# Patient Record
Sex: Female | Born: 1990 | Race: Black or African American | Hispanic: No | Marital: Single | Smoking: Never smoker
Health system: Southern US, Community
[De-identification: ages and names within clinical notes are randomized; demographics above are authoritative.]

## PROBLEM LIST (undated history)

## (undated) ENCOUNTER — Inpatient Hospital Stay (HOSPITAL_COMMUNITY): Payer: Self-pay

## (undated) DIAGNOSIS — R51 Headache: Secondary | ICD-10-CM

## (undated) DIAGNOSIS — B999 Unspecified infectious disease: Secondary | ICD-10-CM

## (undated) HISTORY — DX: Headache: R51

## (undated) HISTORY — DX: Unspecified infectious disease: B99.9

---
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
## 2009-03-12 ENCOUNTER — Emergency Department (HOSPITAL_COMMUNITY): Admission: EM | Admit: 2009-03-12 | Discharge: 2009-03-12 | Payer: Self-pay | Admitting: Family Medicine

## 2009-09-16 ENCOUNTER — Emergency Department (HOSPITAL_COMMUNITY): Admission: EM | Admit: 2009-09-16 | Discharge: 2009-09-16 | Payer: Self-pay | Admitting: Emergency Medicine

## 2009-09-18 ENCOUNTER — Emergency Department (HOSPITAL_COMMUNITY): Admission: EM | Admit: 2009-09-18 | Discharge: 2009-09-18 | Payer: Self-pay | Admitting: Family Medicine

## 2009-10-23 ENCOUNTER — Emergency Department (HOSPITAL_COMMUNITY): Admission: EM | Admit: 2009-10-23 | Discharge: 2009-10-23 | Payer: Self-pay | Admitting: Family Medicine

## 2010-03-18 ENCOUNTER — Emergency Department (HOSPITAL_COMMUNITY)
Admission: EM | Admit: 2010-03-18 | Discharge: 2010-03-18 | Payer: Self-pay | Source: Home / Self Care | Admitting: Family Medicine

## 2010-05-07 ENCOUNTER — Emergency Department (HOSPITAL_COMMUNITY): Admission: EM | Admit: 2010-05-07 | Discharge: 2010-05-07 | Payer: Self-pay | Admitting: Family Medicine

## 2010-09-12 LAB — WET PREP, GENITAL: Yeast Wet Prep HPF POC: NONE SEEN

## 2010-09-12 LAB — POCT URINALYSIS DIPSTICK
Bilirubin Urine: NEGATIVE
Glucose, UA: NEGATIVE mg/dL
Ketones, ur: NEGATIVE mg/dL
Protein, ur: NEGATIVE mg/dL

## 2010-09-12 LAB — POCT PREGNANCY, URINE: Preg Test, Ur: NEGATIVE

## 2010-09-12 LAB — GC/CHLAMYDIA PROBE AMP, GENITAL
Chlamydia, DNA Probe: NEGATIVE
GC Probe Amp, Genital: NEGATIVE

## 2010-09-23 LAB — URINALYSIS, ROUTINE W REFLEX MICROSCOPIC
Glucose, UA: NEGATIVE mg/dL
Ketones, ur: NEGATIVE mg/dL
pH: 6.5 (ref 5.0–8.0)

## 2010-09-23 LAB — POCT URINALYSIS DIP (DEVICE)
Glucose, UA: 100 mg/dL — AB
Hgb urine dipstick: NEGATIVE
Nitrite: POSITIVE — AB
Urobilinogen, UA: 1 mg/dL (ref 0.0–1.0)
pH: 6 (ref 5.0–8.0)

## 2010-09-23 LAB — URINE MICROSCOPIC-ADD ON

## 2010-09-23 LAB — URINE CULTURE

## 2010-10-04 LAB — WET PREP, GENITAL
Clue Cells Wet Prep HPF POC: NONE SEEN
Yeast Wet Prep HPF POC: NONE SEEN

## 2010-10-04 LAB — POCT PREGNANCY, URINE: Preg Test, Ur: NEGATIVE

## 2010-10-04 LAB — POCT URINALYSIS DIP (DEVICE)
Bilirubin Urine: NEGATIVE
Glucose, UA: NEGATIVE mg/dL
Ketones, ur: NEGATIVE mg/dL
Specific Gravity, Urine: 1.02 (ref 1.005–1.030)

## 2010-10-04 LAB — URINE CULTURE

## 2010-12-02 ENCOUNTER — Inpatient Hospital Stay (INDEPENDENT_AMBULATORY_CARE_PROVIDER_SITE_OTHER)
Admission: RE | Admit: 2010-12-02 | Discharge: 2010-12-02 | Disposition: A | Discharge status: Home / Self Care | Payer: 59 | Source: Ambulatory Visit | Attending: Family Medicine | Admitting: Family Medicine

## 2010-12-02 DIAGNOSIS — N760 Acute vaginitis: Secondary | ICD-10-CM

## 2010-12-02 DIAGNOSIS — R10814 Left lower quadrant abdominal tenderness: Secondary | ICD-10-CM

## 2010-12-02 LAB — POCT URINALYSIS DIP (DEVICE)
Glucose, UA: NEGATIVE mg/dL
Ketones, ur: NEGATIVE mg/dL
Specific Gravity, Urine: 1.025 (ref 1.005–1.030)
Urobilinogen, UA: 0.2 mg/dL (ref 0.0–1.0)

## 2010-12-02 LAB — WET PREP, GENITAL: Clue Cells Wet Prep HPF POC: NONE SEEN

## 2010-12-02 LAB — POCT PREGNANCY, URINE: Preg Test, Ur: NEGATIVE

## 2010-12-03 LAB — URINE CULTURE: Colony Count: 45000

## 2012-04-01 ENCOUNTER — Encounter: Payer: 59 | Admitting: Obstetrics and Gynecology

## 2012-04-02 ENCOUNTER — Ambulatory Visit (INDEPENDENT_AMBULATORY_CARE_PROVIDER_SITE_OTHER): Payer: 59 | Admitting: Obstetrics and Gynecology

## 2012-04-02 DIAGNOSIS — Z331 Pregnant state, incidental: Secondary | ICD-10-CM

## 2012-04-03 LAB — PRENATAL PANEL VII
Basophils Absolute: 0 10*3/uL (ref 0.0–0.1)
Basophils Relative: 0 % (ref 0–1)
Eosinophils Absolute: 0 10*3/uL (ref 0.0–0.7)
HIV: NONREACTIVE
Hemoglobin: 12.8 g/dL (ref 12.0–15.0)
Hepatitis B Surface Ag: NEGATIVE
MCHC: 33.8 g/dL (ref 30.0–36.0)
Monocytes Relative: 4 % (ref 3–12)
Neutro Abs: 3.1 10*3/uL (ref 1.7–7.7)
Neutrophils Relative %: 54 % (ref 43–77)
Platelets: 244 10*3/uL (ref 150–400)

## 2012-04-04 LAB — CULTURE, OB URINE: Colony Count: NO GROWTH

## 2012-04-06 LAB — HEMOGLOBINOPATHY EVALUATION
Hemoglobin Other: 0 %
Hgb A2 Quant: 2.5 % (ref 2.2–3.2)
Hgb A: 97.5 % (ref 96.8–97.8)
Hgb F Quant: 0 % (ref 0.0–2.0)
Hgb S Quant: 0 %

## 2012-04-08 ENCOUNTER — Telehealth: Payer: Self-pay | Admitting: Obstetrics and Gynecology

## 2012-04-08 NOTE — Telephone Encounter (Signed)
Tc to pt regarding msg, lm on vm to call back. 

## 2012-04-08 NOTE — Telephone Encounter (Signed)
Pt returned call, pt had several questions about her pregnancy and what to expect @ the next visit, all of pt's questions were answered, pt says she feels a lot better now.

## 2012-04-23 DIAGNOSIS — Z331 Pregnant state, incidental: Secondary | ICD-10-CM | POA: Insufficient documentation

## 2012-04-30 ENCOUNTER — Ambulatory Visit (INDEPENDENT_AMBULATORY_CARE_PROVIDER_SITE_OTHER): Payer: 59 | Admitting: Obstetrics and Gynecology

## 2012-04-30 VITALS — BP 120/64 | Wt 176.0 lb

## 2012-04-30 DIAGNOSIS — Z331 Pregnant state, incidental: Secondary | ICD-10-CM

## 2012-04-30 LAB — OB RESULTS CONSOLE GC/CHLAMYDIA
Chlamydia: NEGATIVE
Gonorrhea: NEGATIVE

## 2012-04-30 MED ORDER — METRONIDAZOLE 500 MG PO TABS: 500.0000 mg | ORAL_TABLET | Freq: Two times a day (BID) | ORAL | Status: DC

## 2012-04-30 MED ORDER — CITRANATAL 90 DHA 90-1 & 250 MG PO MISC: 1.0000 | Freq: Every day | ORAL | Status: DC

## 2012-04-30 MED ORDER — TERCONAZOLE 0.4 % VA CREA: 1.0000 | TOPICAL_CREAM | Freq: Every day | VAGINAL | Status: DC

## 2012-04-30 NOTE — Progress Notes (Signed)
Pt is here for her NOB work-up. Pt stated she has been having a lot of headaches and some sharpe pains . Pt stated no other issues today.

## 2012-05-04 LAB — PAP IG, CT-NG, RFX HPV ASCU: GC Probe Amp: NEGATIVE

## 2012-05-05 ENCOUNTER — Encounter: Payer: Self-pay | Admitting: Obstetrics and Gynecology

## 2012-05-05 NOTE — Progress Notes (Signed)
Patient ID: Tiffany Li, female   DOB: January 05, 1991, 21 y.o.   MRN: [id number] Tiffany Li is a 21 y.o. female presenting for new ob visit. Not using birth control at time of concepiton, without N,V, taking PNV, certain of LMP for [facility]. @MED  @IPILAPH @ OB History    Grav Para Term Preterm Abortions TAB SAB Ect Mult Living   1              Past Medical History  Diagnosis Date  . Infection     Yeast;not frequent  . Infection     BV;was frequent w/ Nuvaring/IUD  . Infection     UTI;not frequent  . Headache    Past Surgical History  Procedure Date  . Eye surgery 2005    Infection removed from a stye;outpt surgery   Family History: family history includes Gestational diabetes in her cousin; Hypertension in her brother, father, maternal grandfather, maternal grandmother, mother, paternal grandfather, and paternal grandmother; and Migraines in her maternal aunt and mother. Social History:  reports that she has never smoked. She has never used smokeless tobacco. She reports that she drinks alcohol. She reports that she does not use illicit drugs.  @ROS @    Blood pressure 120/64, weight 176 lb (79.833 kg), last menstrual period 02/18/2012. Physical exam: Calm, no distress, HEENT wnl lungs clear bilaterally, breasts bilaterally no masses, dimpling, or drainage, AP RRR, abd soft, gravid, nt, bowel sounds active, abdomen nontender,  Normal hair distrubition mons pubis,  EGBUS WNL, sterile speculum exam,  vagina pink, moist normal rugae,  cerix LTC, no cervical motion tenderness, No adnexal masses or tenderness Uterus size 11 Scant white discharge. DTR + 1 bilaterally no clonus No edema to lower extremities  Prenatal labs: ABO, Rh: O/POS/-- (10/04 1131) Antibody: NEG (10/04 1131) Rubella:  Immune RPR: NON REAC (10/04 1131)  HBsAg: NEGATIVE (10/04 1131)  HIV: NON REACTIVE (10/04 1131)    Assessment/Plan: [redacted]w[redacted]d GC/CHL sent WET PREP neg PAP sent ULTRASOUND Genetic testing  reviewed will call if desires 1st trimester screen. Collaboration with Dr. Su Hilt. [facility], Tiffany Li 05/05/2012, 10:33 PM late entry from 04/30/2012 Lavera Guise, CNM

## 2012-05-26 ENCOUNTER — Ambulatory Visit (INDEPENDENT_AMBULATORY_CARE_PROVIDER_SITE_OTHER): Payer: 59 | Admitting: Obstetrics and Gynecology

## 2012-05-26 VITALS — BP 110/60 | Wt 169.0 lb

## 2012-05-26 DIAGNOSIS — Z3400 Encounter for supervision of normal first pregnancy, unspecified trimester: Secondary | ICD-10-CM

## 2012-05-26 DIAGNOSIS — Z0289 Encounter for other administrative examinations: Secondary | ICD-10-CM

## 2012-05-26 NOTE — Progress Notes (Signed)
Had reflux type symptoms yesterday, with vomiting x 1.  Now resolved. Concerned if BV has resolved--still has some d/c. Sporadic HAs--had before pregnancy.  Can try Ibuprophen 600 mg po q 6 prn. Declines genetic screening. Wet prep:  Lactobacilli, leukorrhea.  Comfort measures reviewed. [place] in 4 weeks for anatomy

## 2012-05-26 NOTE — Progress Notes (Signed)
Pt stated been having some pain in her stomach/ c/o of vomiting yesterday. Pt stated no other issues

## 2012-06-22 ENCOUNTER — Ambulatory Visit (INDEPENDENT_AMBULATORY_CARE_PROVIDER_SITE_OTHER): Payer: 59

## 2012-06-22 ENCOUNTER — Encounter: Payer: Self-pay | Admitting: Obstetrics and Gynecology

## 2012-06-22 ENCOUNTER — Ambulatory Visit (INDEPENDENT_AMBULATORY_CARE_PROVIDER_SITE_OTHER): Payer: 59 | Admitting: Obstetrics and Gynecology

## 2012-06-22 VITALS — BP 130/78 | Wt 172.0 lb

## 2012-06-22 DIAGNOSIS — Z3400 Encounter for supervision of normal first pregnancy, unspecified trimester: Secondary | ICD-10-CM

## 2012-06-22 DIAGNOSIS — Z3689 Encounter for other specified antenatal screening: Secondary | ICD-10-CM

## 2012-06-22 DIAGNOSIS — Z331 Pregnant state, incidental: Secondary | ICD-10-CM

## 2012-06-22 NOTE — Progress Notes (Signed)
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  She does not have time to rest or drink water.  She would like to cut back her hours to 24-32 hrs a week.  I agree this will be better for her especially with maintaining hydration.

## 2012-06-22 NOTE — Patient Instructions (Signed)
ABCs of Pregnancy  A  Antepartum care is very important. Be sure you see your doctor and get prenatal care as soon as you think you are pregnant. At this time, you will be tested for infection, genetic abnormalities and potential problems with you and the pregnancy. This is the time to discuss diet, exercise, work, medications, labor, pain medication during labor and the possibility of a cesarean delivery. Ask any questions that may concern you. It is important to see your doctor regularly throughout your pregnancy. Avoid exposure to toxic substances and chemicals - such as cleaning solvents, lead and mercury, some insecticides, and paint. Pregnant women should avoid exposure to paint fumes, and fumes that cause you to feel ill, dizzy or faint. When possible, it is a good idea to have a pre-pregnancy consultation with your caregiver to begin some important recommendations your caregiver suggests such as, taking folic acid, exercising, quitting smoking, avoiding alcoholic beverages, etc.  B  Breastfeeding is the healthiest choice for both you and your baby. It has many nutritional benefits for the baby and health benefits for the mother. It also creates a very tight and loving bond between the baby and mother. Talk to your doctor, your family and friends, and your employer about how you choose to feed your baby and how they can support you in your decision. Not all birth defects can be prevented, but a woman can take actions that may increase her chance of having a healthy baby. Many birth defects happen very early in pregnancy, sometimes before a woman even knows she is pregnant. Birth defects or abnormalities of any child in your or the father's family should be discussed with your caregiver. Get a good support bra as your breast size changes. Wear it especially when you exercise and when nursing.   C  Celebrate the news of your pregnancy with the your spouse/father and family. Childbirth classes are helpful to  take for you and the spouse/father because it helps to understand what happens during the pregnancy, labor and delivery. Cesarean delivery should be discussed with your doctor so you are prepared for that possibility. The pros and cons of circumcision if it is a boy, should be discussed with your pediatrician. Cigarette smoking during pregnancy can result in low birth weight babies. It has been associated with infertility, miscarriages, tubal pregnancies, infant death (mortality) and poor health (morbidity) in childhood. Additionally, cigarette smoking may cause long-term learning disabilities. If you smoke, you should try to quit before getting pregnant and not smoke during the pregnancy. Secondary smoke may also harm a mother and her developing baby. It is a good idea to ask people to stop smoking around you during your pregnancy and after the baby is born. Extra calcium is necessary when you are pregnant and is found in your prenatal vitamin, in dairy products, green leafy vegetables and in calcium supplements.  D  A healthy diet according to your current weight and height, along with vitamins and mineral supplements should be discussed with your caregiver. Domestic abuse or violence should be made known to your doctor right away to get the situation corrected. Drink more water when you exercise to keep hydrated. Discomfort of your back and legs usually develops and progresses from the middle of the second trimester through to delivery of the baby. This is because of the enlarging baby and uterus, which may also affect your balance. Do not take illegal drugs. Illegal drugs can seriously harm the baby and you. Drink extra   fluids (water is best) throughout pregnancy to help your body keep up with the increases in your blood volume. Drink at least 6 to 8 glasses of water, fruit juice, or milk each day. A good way to know you are drinking enough fluid is when your urine looks almost like clear water or is very light  yellow.   E  Eat healthy to get the nutrients you and your unborn baby need. Your meals should include the five basic food groups. Exercise (30 minutes of light to moderate exercise a day) is important and encouraged during pregnancy, if there are no medical problems or problems with the pregnancy. Exercise that causes discomfort or dizziness should be stopped and reported to your caregiver. Emotions during pregnancy can change from being ecstatic to depression and should be understood by you, your partner and your family.  F  Fetal screening with ultrasound, amniocentesis and monitoring during pregnancy and labor is common and sometimes necessary. Take 400 micrograms of folic acid daily both before, when possible, and during the first few months of pregnancy to reduce the risk of birth defects of the brain and spine. All women who could possibly become pregnant should take a vitamin with folic acid, every day. It is also important to eat a healthy diet with fortified foods (enriched grain products, including cereals, rice, breads, and pastas) and foods with natural sources of folate (orange juice, green leafy vegetables, beans, peanuts, broccoli, asparagus, peas, and lentils). The father should be involved with all aspects of the pregnancy including, the prenatal care, childbirth classes, labor, delivery, and postpartum time. Fathers may also have emotional concerns about being a father, financial needs, and raising a family.  G  Genetic testing should be done appropriately. It is important to know your family and the father's history. If there have been problems with pregnancies or birth defects in your family, report these to your doctor. Also, genetic counselors can talk with you about the information you might need in making decisions about having a family. You can call a major medical center in your area for help in finding a board-certified genetic counselor. Genetic testing and counseling should be done  before pregnancy when possible, especially if there is a history of problems in the mother's or father's family. Certain ethnic backgrounds are more at risk for genetic defects.  H  Get familiar with the hospital where you will be having your baby. Get to know how long it takes to get there, the labor and delivery area, and the hospital procedures. Be sure your medical insurance is accepted there. Get your home ready for the baby including, clothes, the baby's room (when possible), furniture and car seat. Hand washing is important throughout the day, especially after handling raw meat and poultry, changing the baby's diaper or using the bathroom. This can help prevent the spread of many bacteria and viruses that cause infection. Your hair may become dry and thinner, but will return to normal a few weeks after the baby is born. Heartburn is a common problem that can be treated by taking antacids recommended by your caregiver, eating smaller meals 5 or 6 times a day, not drinking liquids when eating, drinking between meals and raising the head of your bed 2 to 3 inches.  I  Insurance to cover you, the baby, doctor and hospital should be reviewed so that you will be prepared to pay any costs not covered by your insurance plan. If you do not have medical insurance,   there are usually clinics and services available for you in your community. Take 30 milligrams of iron during your pregnancy as prescribed by your doctor to reduce the risk of low red blood cells (anemia) later in pregnancy. All women of childbearing age should eat a diet rich in iron.  J  There should be a joint effort for the mother, father and any other children to adapt to the pregnancy financially, emotionally, and psychologically during the pregnancy. Join a support group for moms-to-be. Or, join a class on parenting or childbirth. Have the family participate when possible.  K  Know your limits. Let your caregiver know if you experience any of the  following:   · Pain of any kind.  · Strong cramps.  · You develop a lot of weight in a short period of time (5 pounds in 3 to 5 days).  · Vaginal bleeding, leaking of amniotic fluid.  · Headache, vision problems.  · Dizziness, fainting, shortness of breath.  · Chest pain.  · Fever of 102° F (38.9° C) or higher.  · Gush of clear fluid from your vagina.  · Painful urination.  · Domestic violence.  · Irregular heartbeat (palpitations).  · Rapid beating of the heart (tachycardia).  · Constant feeling sick to your stomach (nauseous) and vomiting.  · Trouble walking, fluid retention (edema).  · Muscle weakness.  · If your baby has decreased activity.  · Persistent diarrhea.  · Abnormal vaginal discharge.  · Uterine contractions at 20-minute intervals.  · Back pain that travels down your leg.  L  Learn and practice that what you eat and drink should be in moderation and healthy for you and your baby. Legal drugs such as alcohol and caffeine are important issues for pregnant women. There is no safe amount of alcohol a woman can drink while pregnant. Fetal alcohol syndrome, a disorder characterized by growth retardation, facial abnormalities, and central nervous system dysfunction, is caused by a woman's use of alcohol during pregnancy. Caffeine, found in tea, coffee, soft drinks and chocolate, should also be limited. Be sure to read labels when trying to cut down on caffeine during pregnancy. More than 200 foods, beverages, and over-the-counter medications contain caffeine and have a high salt content! There are coffees and teas that do not contain caffeine.  M  Medical conditions such as diabetes, epilepsy, and high blood pressure should be treated and kept under control before pregnancy when possible, but especially during pregnancy. Ask your caregiver about any medications that may need to be changed or adjusted during pregnancy. If you are currently taking any medications, ask your caregiver if it is safe to take them  while you are pregnant or before getting pregnant when possible. Also, be sure to discuss any herbs or vitamins you are taking. They are medicines, too! Discuss with your doctor all medications, prescribed and over-the-counter, that you are taking. During your prenatal visit, discuss the medications your doctor may give you during labor and delivery.  N  Never be afraid to ask your doctor or caregiver questions about your health, the progress of the pregnancy, family problems, stressful situations, and recommendation for a pediatrician, if you do not have one. It is better to take all precautions and discuss any questions or concerns you may have during your office visits. It is a good idea to write down your questions before you visit the doctor.  O  Over-the-counter cough and cold remedies may contain alcohol or other ingredients that should   be avoided during pregnancy. Ask your caregiver about prescription, herbs or over-the-counter medications that you are taking or may consider taking while pregnant.   P  Physical activity during pregnancy can benefit both you and your baby by lessening discomfort and fatigue, providing a sense of well-being, and increasing the likelihood of early recovery after delivery. Light to moderate exercise during pregnancy strengthens the belly (abdominal) and back muscles. This helps improve posture. Practicing yoga, walking, swimming, and cycling on a stationary bicycle are usually safe exercises for pregnant women. Avoid scuba diving, exercise at high altitudes (over 3000 feet), skiing, horseback riding, contact sports, etc. Always check with your doctor before beginning any kind of exercise, especially during pregnancy and especially if you did not exercise before getting pregnant.  Q  Queasiness, stomach upset and morning sickness are common during pregnancy. Eating a couple of crackers or dry toast before getting out of bed. Foods that you normally love may make you feel sick to  your stomach. You may need to substitute other nutritious foods. Eating 5 or 6 small meals a day instead of 3 large ones may make you feel better. Do not drink with your meals, drink between meals. Questions that you have should be written down and asked during your prenatal visits.  R  Read about and make plans to baby-proof your home. There are important tips for making your home a safer environment for your baby. Review the tips and make your home safer for you and your baby. Read food labels regarding calories, salt and fat content in the food.  S  Saunas, hot tubs, and steam rooms should be avoided while you are pregnant. Excessive high heat may be harmful during your pregnancy. Your caregiver will screen and examine you for sexually transmitted diseases and genetic disorders during your prenatal visits. Learn the signs of labor. Sexual relations while pregnant is safe unless there is a medical or pregnancy problem and your caregiver advises against it.  T  Traveling long distances should be avoided especially in the third trimester of your pregnancy. If you do have to travel out of state, be sure to take a copy of your medical records and medical insurance plan with you. You should not travel long distances without seeing your doctor first. Most airlines will not allow you to travel after 36 weeks of pregnancy. Toxoplasmosis is an infection caused by a parasite that can seriously harm an unborn baby. Avoid eating undercooked meat and handling cat litter. Be sure to wear gloves when gardening. Tingling of the hands and fingers is not unusual and is due to fluid retention. This will go away after the baby is born.  U  Womb (uterus) size increases during the first trimester. Your kidneys will begin to function more efficiently. This may cause you to feel the need to urinate more often. You may also leak urine when sneezing, coughing or laughing. This is due to the growing uterus pressing against your bladder,  which lies directly in front of and slightly under the uterus during the first few months of pregnancy. If you experience burning along with frequency of urination or bloody urine, be sure to tell your doctor. The size of your uterus in the third trimester may cause a problem with your balance. It is advisable to maintain good posture and avoid wearing high heels during this time. An ultrasound of your baby may be necessary during your pregnancy and is safe for you and your baby.  V    Vaccinations are an important concern for pregnant women. Get needed vaccines before pregnancy. Center for Disease Control (www.cdc.gov) has clear guidelines for the use of vaccines during pregnancy. Review the list, be sure to discuss it with your doctor. Prenatal vitamins are helpful and healthy for you and the baby. Do not take extra vitamins except what is recommended. Taking too much of certain vitamins can cause overdose problems. Continuous vomiting should be reported to your caregiver. Varicose veins may appear especially if there is a family history of varicose veins. They should subside after the delivery of the baby. Support hose helps if there is leg discomfort.  W  Being overweight or underweight during pregnancy may cause problems. Try to get within 15 pounds of your ideal weight before pregnancy. Remember, pregnancy is not a time to be dieting! Do not stop eating or start skipping meals as your weight increases. Both you and your baby need the calories and nutrition you receive from a healthy diet. Be sure to consult with your doctor about your diet. There is a formula and diet plan available depending on whether you are overweight or underweight. Your caregiver or nutritionist can help and advise you if necessary.  X  Avoid X-rays. If you must have dental work or diagnostic tests, tell your dentist or physician that you are pregnant so that extra care can be taken. X-rays should only be taken when the risks of not taking  them outweigh the risk of taking them. If needed, only the minimum amount of radiation should be used. When X-rays are necessary, protective lead shields should be used to cover areas of the body that are not being X-rayed.  Y  Your baby loves you. Breastfeeding your baby creates a loving and very close bond between the two of you. Give your baby a healthy environment to live in while you are pregnant. Infants and children require constant care and guidance. Their health and safety should be carefully watched at all times. After the baby is born, rest or take a nap when the baby is sleeping.  Z  Get your ZZZs. Be sure to get plenty of rest. Resting on your side as often as possible, especially on your left side is advised. It provides the best circulation to your baby and helps reduce swelling. Try taking a nap for 30 to 45 minutes in the afternoon when possible. After the baby is born rest or take a nap when the baby is sleeping. Try elevating your feet for that amount of time when possible. It helps the circulation in your legs and helps reduce swelling.   Most information courtesy of the CDC.  Document Released: 06/16/2005 Document Revised: 09/08/2011 Document Reviewed: 02/28/2009  [employer]® Patient Information ©2013 [employer].

## 2012-06-22 NOTE — Progress Notes (Signed)
Pt c/o ligament pain.

## 2012-06-25 ENCOUNTER — Other Ambulatory Visit: Payer: Self-pay | Admitting: Obstetrics and Gynecology

## 2012-06-25 DIAGNOSIS — Z3400 Encounter for supervision of normal first pregnancy, unspecified trimester: Secondary | ICD-10-CM

## 2012-06-25 LAB — US OB COMP + 14 WK

## 2012-06-30 NOTE — L&D Delivery Note (Signed)
Delivery Note At 7:09 PM a viable female, "Pecola Leisure", was delivered via Vaginal, Spontaneous Delivery (Presentation: Left Occiput Anterior).  APGAR: 6, 9; weight .   Placenta status: Intact, Spontaneous.  Cord: 3 vessels with the following complications: None.  Cord pH: NA  Caput crowned for approx 30 min before full crowning--MLE performed under existing epidural and additional lidocaine infiltration. Baby floppy at delivery, but HR >100--cord clamped by CNM, cut by FOB, and baby to warmer.  NICU team called, with baby quickly responsive to drying and stimulation. Skin to skin with mom after assessment by NICU team.  Anesthesia: Epidural Local  Episiotomy: 2nd degree MLE Lacerations: None Suture Repair: 3.0 vicryl Est. Blood Loss (mL): 250 cc  Mom to postpartum.  Baby to skin to skin. Inpatient circ planned.  Nigel Bridgeman 12/03/2012, 7:56 PM

## 2012-07-23 ENCOUNTER — Ambulatory Visit: Payer: 59 | Admitting: Obstetrics and Gynecology

## 2012-07-23 ENCOUNTER — Encounter: Payer: 59 | Admitting: Obstetrics and Gynecology

## 2012-07-23 VITALS — BP 110/58 | Ht 67.0 in | Wt 180.0 lb

## 2012-07-23 DIAGNOSIS — Z3490 Encounter for supervision of normal pregnancy, unspecified, unspecified trimester: Secondary | ICD-10-CM

## 2012-07-23 NOTE — Progress Notes (Signed)
Pt stated no issues today.  

## 2012-07-23 NOTE — Progress Notes (Signed)
Doing well.  No issues. Glucola NV, with TSH and Vit D due to increased fatigue. Feels better since decreasing work hours.

## 2012-08-14 ENCOUNTER — Other Ambulatory Visit: Payer: Self-pay

## 2012-08-20 ENCOUNTER — Ambulatory Visit: Payer: 59 | Admitting: Obstetrics and Gynecology

## 2012-08-20 ENCOUNTER — Other Ambulatory Visit: Payer: 59

## 2012-08-20 ENCOUNTER — Encounter: Payer: Self-pay | Admitting: Obstetrics and Gynecology

## 2012-08-20 VITALS — BP 140/80 | Wt 185.0 lb

## 2012-08-20 DIAGNOSIS — Z1321 Encounter for screening for nutritional disorder: Secondary | ICD-10-CM

## 2012-08-20 DIAGNOSIS — Z331 Pregnant state, incidental: Secondary | ICD-10-CM

## 2012-08-20 DIAGNOSIS — O9A219 Injury, poisoning and certain other consequences of external causes complicating pregnancy, unspecified trimester: Secondary | ICD-10-CM

## 2012-08-20 DIAGNOSIS — Z3400 Encounter for supervision of normal first pregnancy, unspecified trimester: Secondary | ICD-10-CM

## 2012-08-20 LAB — CBC
HCT: 29.9 % — ABNORMAL LOW (ref 36.0–46.0)
Hemoglobin: 10.4 g/dL — ABNORMAL LOW (ref 12.0–15.0)
MCH: 27.7 pg (ref 26.0–34.0)
MCV: 79.7 fL (ref 78.0–100.0)
Platelets: 209 10*3/uL (ref 150–400)
RBC: 3.75 MIL/uL — ABNORMAL LOW (ref 3.87–5.11)
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
## 2012-08-21 LAB — VITAMIN D 25 HYDROXY (VIT D DEFICIENCY, FRACTURES): Vit D, 25-Hydroxy: 23 ng/mL — ABNORMAL LOW (ref 30–89)

## 2012-08-23 ENCOUNTER — Telehealth: Payer: Self-pay | Admitting: Obstetrics and Gynecology

## 2012-08-23 NOTE — Telephone Encounter (Signed)
TC from pt. 9:00 Am. States when got to work felt dizzy and had double vision. Had bagel, fruit and bacon 20-30 min before. + FM. Still feels somewhat dizzyf Has had 2 bottles water. Advised pt eat protein snack, rest and call if no improvement. Discussed eating protein frequently throughout the day to stabilize blood sugar. Pt verbalizes comprehension.

## 2012-08-23 NOTE — Telephone Encounter (Signed)
Questions answered already

## 2012-09-03 ENCOUNTER — Ambulatory Visit: Payer: 59 | Admitting: Certified Nurse Midwife

## 2012-09-03 ENCOUNTER — Encounter: Payer: Self-pay | Admitting: Certified Nurse Midwife

## 2012-09-03 VITALS — BP 124/62 | Wt 191.0 lb

## 2012-09-03 DIAGNOSIS — D649 Anemia, unspecified: Secondary | ICD-10-CM

## 2012-09-03 DIAGNOSIS — M549 Dorsalgia, unspecified: Secondary | ICD-10-CM

## 2012-09-03 DIAGNOSIS — Z331 Pregnant state, incidental: Secondary | ICD-10-CM

## 2012-09-03 NOTE — Progress Notes (Signed)
[redacted]w[redacted]d Pt c/o constant back pain and pain in shoulder blades.

## 2012-09-03 NOTE — Progress Notes (Signed)
Patient ID: Tiffany Li, female   DOB: 1990/08/16, 22 y.o.   MRN: [id number] Overall pt. Is doing well.  Works full time causing some fatigue and low back Pain without injury .  Not lifting greater than 15  lbs. Has support at work and home. Denies contractions, vag. bleeding or UTI symptoms.    PE  Heart: RRR without murmurs  Lungs are C to A  Abd. Soft and non-tender FHT's regular  Back:  Nl RoM, Nl curvature of the spine   Ambulating well  Vits and Fe sometimes upset stomach Plan Plans to eat foods with more protein and FE  Increase Fluids  Support shoes at work  Tylenol prn  Heat to back, massage  Call if no improvement  C. Armer CNM, FNP

## 2012-09-06 ENCOUNTER — Telehealth: Payer: Self-pay

## 2012-09-06 NOTE — Telephone Encounter (Signed)
Spoke to pt. Vit D level and iron levels are low. Iron supplement 325 mg Ferrous sulfate recommended daily, along w/ PNV. For Vit D deficiency, AR  Would like her to take 4,000 IU's daily. Pt is agreeable. Pt c/o back pain not improving. She wonders if she can have something for it. Per VL on call we can give her Flexeril 10 mg tid prn. # 36 NO RF's . Rx  Called to [facility] pt pharmacy. Melody Comas A

## 2012-09-16 ENCOUNTER — Ambulatory Visit: Payer: 59 | Admitting: Obstetrics and Gynecology

## 2012-09-16 ENCOUNTER — Encounter: Payer: Self-pay | Admitting: Obstetrics and Gynecology

## 2012-09-16 VITALS — BP 126/62 | Wt 191.0 lb

## 2012-09-16 DIAGNOSIS — Z331 Pregnant state, incidental: Secondary | ICD-10-CM

## 2012-09-16 DIAGNOSIS — D649 Anemia, unspecified: Secondary | ICD-10-CM

## 2012-09-16 NOTE — Progress Notes (Signed)
Pt stated she wanted to go out on maternity   leave from one  Of her 2 jobs. 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 Pt stated no other issues today.

## 2012-11-08 ENCOUNTER — Inpatient Hospital Stay (HOSPITAL_COMMUNITY)
Admission: AD | Admit: 2012-11-08 | Discharge: 2012-11-08 | Disposition: A | Discharge status: Home / Self Care | Payer: 59 | Source: Ambulatory Visit | Attending: Obstetrics and Gynecology | Admitting: Obstetrics and Gynecology

## 2012-11-08 ENCOUNTER — Encounter (HOSPITAL_COMMUNITY): Payer: Self-pay | Admitting: *Deleted

## 2012-11-08 ENCOUNTER — Inpatient Hospital Stay (HOSPITAL_COMMUNITY): Payer: 59

## 2012-11-08 DIAGNOSIS — O368190 Decreased fetal movements, unspecified trimester, not applicable or unspecified: Secondary | ICD-10-CM | POA: Insufficient documentation

## 2012-11-08 DIAGNOSIS — O4700 False labor before 37 completed weeks of gestation, unspecified trimester: Secondary | ICD-10-CM | POA: Insufficient documentation

## 2012-11-15 ENCOUNTER — Encounter (HOSPITAL_COMMUNITY): Payer: Self-pay

## 2012-11-15 ENCOUNTER — Inpatient Hospital Stay (HOSPITAL_COMMUNITY)
Admission: AD | Admit: 2012-11-15 | Discharge: 2012-11-15 | Disposition: A | Discharge status: Home / Self Care | Payer: 59 | Source: Ambulatory Visit | Attending: Obstetrics and Gynecology | Admitting: Obstetrics and Gynecology

## 2012-11-15 DIAGNOSIS — R109 Unspecified abdominal pain: Secondary | ICD-10-CM | POA: Insufficient documentation

## 2012-11-15 DIAGNOSIS — O479 False labor, unspecified: Secondary | ICD-10-CM | POA: Insufficient documentation

## 2012-11-15 DIAGNOSIS — Z0289 Encounter for other administrative examinations: Secondary | ICD-10-CM

## 2012-11-15 DIAGNOSIS — D649 Anemia, unspecified: Secondary | ICD-10-CM

## 2012-11-15 DIAGNOSIS — O471 False labor at or after 37 completed weeks of gestation: Secondary | ICD-10-CM

## 2012-11-15 MED ORDER — BUTORPHANOL TARTRATE 1 MG/ML IJ SOLN
1.0000 mg | Freq: Once | INTRAMUSCULAR | Status: AC | PRN
  Administered 2012-11-15: 1 mg via INTRAVENOUS
  Filled 2012-11-15: qty 1

## 2012-11-15 MED ORDER — LACTATED RINGERS IV BOLUS (SEPSIS)
1000.0000 mL | Freq: Once | INTRAVENOUS | Status: AC
  Administered 2012-11-15: 1000 mL via INTRAVENOUS

## 2012-11-15 NOTE — MAU Provider Note (Signed)
History   Tiffany Li is a 22y.o. BF at [redacted]w[redacted]d who presents from office for labor check.  Cx "1cm" per office exam, but RN from office calling report said cx was "closed." Pt reports ctxs q 2-5 min since 0845.  No LOF or VB.  GFM.  No UTI or PIH s/s.  Pt's pain is primarily rectal pressure, and pain that run along midline of abdomen w/ ctxs.  Accompanied by s.o.  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  Pt did have a "small" BM earlier this AM.  States she has been having intermittent "BH" ctxs over the last week.   . Patient Active Problem List   Diagnosis Date Noted  . Anemia 09/03/2012  . 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 Pregnant state, incidental 04/23/2012    CSN: [id number]  Arrival date and time: 11/15/12 1229   None     Chief Complaint  Patient presents with  . Contractions   HPI  OB History   Grav Para Term Preterm Abortions TAB SAB Ect Mult Living   1               Past Medical History  Diagnosis Date  . Infection     Yeast;not frequent  . Infection     BV;was frequent w/ Nuvaring/IUD  . Infection     UTI;not frequent  . Headache     Past Surgical History  Procedure Laterality Date  . Eye surgery  2005    Infection removed from a stye;outpt surgery    Family History  Problem Relation Age of Onset  . Hypertension Mother   . Hypertension Brother   . Hypertension Maternal Grandmother   . Hypertension Maternal Grandfather   . Hypertension Paternal Grandmother   . Hypertension Paternal Grandfather   . Hypertension Father   . Migraines Mother   . Migraines Maternal Aunt   . Gestational diabetes Cousin     maternal    History  Substance Use Topics  . Smoking status: Never Smoker   . Smokeless tobacco: Never Used  . Alcohol Use: Yes     Comment: Occasionally    Allergies: No Known Allergies  Prescriptions prior to admission  Medication Sig Dispense Refill  . cholecalciferol (VITAMIN D) 1000 UNITS tablet Take  1,000 Units by mouth daily.       . cyclobenzaprine (FLEXERIL) 10 MG tablet Take 10 mg by mouth 3 (three) times daily as needed for muscle spasms.      . diphenhydrAMINE (BENADRYL) 25 MG tablet Take 25 mg by mouth at bedtime as needed for sleep.      Marland Kitchen docusate sodium (COLACE) 100 MG capsule Take 100 mg by mouth 2 (two) times daily.      . ferrous fumarate (HEMOCYTE - 106 MG FE) 325 (106 FE) MG TABS Take 1 tablet by mouth daily.       . Prenatal Vit-Fe Sulfate-FA (PRENATAL VITAMIN PO) Take 1 tablet by mouth daily.         ROS--see HPI Physical Exam   Blood pressure 124/72, pulse 98, temperature 97.8 F (36.6 C), temperature source Oral, resp. rate 16, height 5\' 7"  (1.702 m), weight 197 lb 8 oz (89.585 kg), last menstrual period 02/18/2012.  Physical Exam  Constitutional: She is oriented to person, place, and time. She appears well-developed and well-nourished. No distress.  HENT:  Head: Normocephalic and atraumatic.  Eyes: Pupils are equal, round, and reactive to  light.  Cardiovascular: Normal rate.   Respiratory: Effort normal.  GI: Soft.  gravid  Genitourinary:  Deferred cervical exam  Neurological: She is alert and oriented to person, place, and time.  Skin: Skin is warm and dry.  Psychiatric: She has a normal mood and affect. Her behavior is normal. Judgment and thought content normal.    MAU Course  Procedures 1. One Liter LR 2. Stadol 1mg  IV x1 w/ good benefit 3. NST--130, reactive, no decels, moderate variability; TOCO: occ'l ctxs w/ some UI  Assessment and Plan  1. [redacted]w[redacted]d 2. Threatened labor at term 3. Cat I FHT 4. Low Vit d and Hgb during pregnancy  1. D/c'd home after Stadol helped relieve some of her pain; she stated she didn't like the way it made her feel overall though.   2. Labor precautions and FKC rev'd 3. Has appt this Thurs which I encouraged her to keep, or f/u prn   Amaryah Mallen H 11/15/2012, 2:58 PM

## 2012-11-15 NOTE — MAU Note (Signed)
Pt states was at MD office today, cervix is very posterior. Began having ctx's this am around 0845, denies bleeding or lof. Now ctx's are q2 minutes apart.

## 2012-11-30 ENCOUNTER — Encounter (HOSPITAL_COMMUNITY): Payer: Self-pay | Admitting: *Deleted

## 2012-11-30 ENCOUNTER — Telehealth (HOSPITAL_COMMUNITY): Payer: Self-pay | Admitting: *Deleted

## 2012-11-30 LAB — OB RESULTS CONSOLE GBS: GBS: POSITIVE

## 2012-11-30 NOTE — Telephone Encounter (Signed)
Preadmission screen  

## 2012-12-01 ENCOUNTER — Inpatient Hospital Stay (HOSPITAL_COMMUNITY)
Admission: RE | Admit: 2012-12-01 | Discharge: 2012-12-05 | DRG: 775 | Disposition: A | Discharge status: Home / Self Care | Payer: 59 | Source: Ambulatory Visit | Attending: Obstetrics and Gynecology | Admitting: Obstetrics and Gynecology

## 2012-12-01 ENCOUNTER — Encounter (HOSPITAL_COMMUNITY): Payer: Self-pay

## 2012-12-01 VITALS — BP 100/62 | HR 77 | Temp 97.6°F | Resp 20 | Ht 67.0 in | Wt 197.0 lb

## 2012-12-01 DIAGNOSIS — Z22330 Carrier of Group B streptococcus: Secondary | ICD-10-CM

## 2012-12-01 DIAGNOSIS — O480 Post-term pregnancy: Principal | ICD-10-CM | POA: Diagnosis present

## 2012-12-01 DIAGNOSIS — D649 Anemia, unspecified: Secondary | ICD-10-CM

## 2012-12-01 DIAGNOSIS — O99892 Other specified diseases and conditions complicating childbirth: Secondary | ICD-10-CM | POA: Diagnosis present

## 2012-12-01 DIAGNOSIS — Z0289 Encounter for other administrative examinations: Secondary | ICD-10-CM

## 2012-12-01 LAB — CBC
HCT: 33.4 % — ABNORMAL LOW (ref 36.0–46.0)
MCHC: 33.5 g/dL (ref 30.0–36.0)
Platelets: 206 10*3/uL (ref 150–400)
RDW: 14.1 % (ref 11.5–15.5)
WBC: 6.7 10*3/uL (ref 4.0–10.5)

## 2012-12-01 MED ORDER — LACTATED RINGERS IV SOLN
INTRAVENOUS | Status: DC
  Administered 2012-12-01 – 2012-12-02 (×2): via INTRAVENOUS
  Administered 2012-12-03: 1000 mL via INTRAVENOUS

## 2012-12-01 MED ORDER — OXYTOCIN BOLUS FROM INFUSION: 500.0000 mL | INTRAVENOUS | Status: DC

## 2012-12-01 MED ORDER — OXYCODONE-ACETAMINOPHEN 5-325 MG PO TABS: 1.0000 | ORAL_TABLET | ORAL | Status: DC | PRN

## 2012-12-01 MED ORDER — OXYTOCIN 40 UNITS IN LACTATED RINGERS INFUSION - SIMPLE MED: 62.5000 mL/h | INTRAVENOUS | Status: DC

## 2012-12-01 MED ORDER — PENICILLIN G POTASSIUM 5000000 UNITS IJ SOLR
5.0000 10*6.[IU] | Freq: Once | INTRAVENOUS | Status: DC
  Filled 2012-12-01: qty 5

## 2012-12-01 MED ORDER — IBUPROFEN 600 MG PO TABS: 600.0000 mg | ORAL_TABLET | Freq: Four times a day (QID) | ORAL | Status: DC | PRN

## 2012-12-01 MED ORDER — ONDANSETRON HCL 4 MG/2ML IJ SOLN
4.0000 mg | Freq: Four times a day (QID) | INTRAMUSCULAR | Status: DC | PRN
  Administered 2012-12-03: 4 mg via INTRAVENOUS
  Filled 2012-12-01 (×2): qty 2

## 2012-12-01 MED ORDER — CITRIC ACID-SODIUM CITRATE 334-500 MG/5ML PO SOLN
30.0000 mL | ORAL | Status: DC | PRN
  Filled 2012-12-01: qty 15

## 2012-12-01 MED ORDER — ACETAMINOPHEN 325 MG PO TABS
650.0000 mg | ORAL_TABLET | ORAL | Status: DC | PRN
  Administered 2012-12-02: 650 mg via ORAL
  Filled 2012-12-01: qty 2

## 2012-12-01 MED ORDER — OXYTOCIN 40 UNITS IN LACTATED RINGERS INFUSION - SIMPLE MED
1.0000 m[IU]/min | INTRAVENOUS | Status: DC
  Filled 2012-12-01: qty 1000

## 2012-12-01 MED ORDER — TERBUTALINE SULFATE 1 MG/ML IJ SOLN: 0.2500 mg | Freq: Once | INTRAMUSCULAR | Status: AC | PRN

## 2012-12-01 MED ORDER — SODIUM CHLORIDE 0.9 % IV SOLN: 250.0000 mL | INTRAVENOUS | Status: DC | PRN

## 2012-12-01 MED ORDER — SODIUM CHLORIDE 0.9 % IJ SOLN: 3.0000 mL | INTRAMUSCULAR | Status: DC | PRN

## 2012-12-01 MED ORDER — FENTANYL CITRATE 0.05 MG/ML IJ SOLN: 100.0000 ug | INTRAMUSCULAR | Status: DC | PRN

## 2012-12-01 MED ORDER — LIDOCAINE HCL (PF) 1 % IJ SOLN
30.0000 mL | INTRAMUSCULAR | Status: DC | PRN
  Administered 2012-12-03: 30 mL via SUBCUTANEOUS
  Filled 2012-12-01 (×2): qty 30

## 2012-12-01 MED ORDER — SODIUM CHLORIDE 0.9 % IJ SOLN: 3.0000 mL | Freq: Two times a day (BID) | INTRAMUSCULAR | Status: DC

## 2012-12-01 MED ORDER — LACTATED RINGERS IV SOLN
500.0000 mL | INTRAVENOUS | Status: DC | PRN
  Administered 2012-12-02: 500 mL via INTRAVENOUS

## 2012-12-01 MED ORDER — PENICILLIN G POTASSIUM 5000000 UNITS IJ SOLR
2.5000 10*6.[IU] | INTRAVENOUS | Status: DC
  Filled 2012-12-01 (×5): qty 2.5

## 2012-12-01 MED ORDER — ZOLPIDEM TARTRATE 5 MG PO TABS
5.0000 mg | ORAL_TABLET | Freq: Every evening | ORAL | Status: DC | PRN
  Administered 2012-12-01: 5 mg via ORAL
  Filled 2012-12-01: qty 1

## 2012-12-01 MED ORDER — HYDROXYZINE HCL 50 MG PO TABS
50.0000 mg | ORAL_TABLET | Freq: Four times a day (QID) | ORAL | Status: DC | PRN
  Filled 2012-12-01: qty 1

## 2012-12-01 MED ORDER — MISOPROSTOL 25 MCG QUARTER TABLET
25.0000 ug | ORAL_TABLET | ORAL | Status: DC | PRN
  Administered 2012-12-01 – 2012-12-02 (×3): 25 ug via VAGINAL
  Filled 2012-12-01 (×2): qty 0.25
  Filled 2012-12-01: qty 1
  Filled 2012-12-01 (×2): qty 0.25
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
## 2012-12-02 DIAGNOSIS — O480 Post-term pregnancy: Secondary | ICD-10-CM | POA: Diagnosis present

## 2012-12-02 LAB — RPR: RPR Ser Ql: NONREACTIVE

## 2012-12-02 MED ORDER — EPHEDRINE 5 MG/ML INJ
10.0000 mg | INTRAVENOUS | Status: DC | PRN
  Filled 2012-12-02: qty 2

## 2012-12-02 MED ORDER — LACTATED RINGERS IV SOLN
500.0000 mL | Freq: Once | INTRAVENOUS | Status: AC
  Administered 2012-12-03: 500 mL via INTRAVENOUS

## 2012-12-02 MED ORDER — EPHEDRINE 5 MG/ML INJ
10.0000 mg | INTRAVENOUS | Status: DC | PRN
  Filled 2012-12-02: qty 2
  Filled 2012-12-02: qty 4

## 2012-12-02 MED ORDER — DIPHENHYDRAMINE HCL 50 MG/ML IJ SOLN: 12.5000 mg | INTRAMUSCULAR | Status: DC | PRN

## 2012-12-02 MED ORDER — PHENYLEPHRINE 40 MCG/ML (10ML) SYRINGE FOR IV PUSH (FOR BLOOD PRESSURE SUPPORT)
80.0000 ug | PREFILLED_SYRINGE | INTRAVENOUS | Status: DC | PRN
  Filled 2012-12-02: qty 5
  Filled 2012-12-02: qty 2

## 2012-12-02 MED ORDER — PENICILLIN G POTASSIUM 5000000 UNITS IJ SOLR
5.0000 10*6.[IU] | Freq: Once | INTRAVENOUS | Status: AC
  Administered 2012-12-02: 5 10*6.[IU] via INTRAVENOUS
  Filled 2012-12-02: qty 5

## 2012-12-02 MED ORDER — OXYTOCIN 40 UNITS IN LACTATED RINGERS INFUSION - SIMPLE MED
1.0000 m[IU]/min | INTRAVENOUS | Status: DC
  Administered 2012-12-03: 2 m[IU]/min via INTRAVENOUS
  Administered 2012-12-03: 8 m[IU]/min via INTRAVENOUS
  Administered 2012-12-03: 1 m[IU]/min via INTRAVENOUS
  Administered 2012-12-03: 3 m[IU]/min via INTRAVENOUS
  Administered 2012-12-03: 4 m[IU]/min via INTRAVENOUS

## 2012-12-02 MED ORDER — PHENYLEPHRINE 40 MCG/ML (10ML) SYRINGE FOR IV PUSH (FOR BLOOD PRESSURE SUPPORT)
80.0000 ug | PREFILLED_SYRINGE | INTRAVENOUS | Status: DC | PRN
  Filled 2012-12-02: qty 2

## 2012-12-02 MED ORDER — FENTANYL 2.5 MCG/ML BUPIVACAINE 1/10 % EPIDURAL INFUSION (WH - ANES)
14.0000 mL/h | INTRAMUSCULAR | Status: DC | PRN
  Administered 2012-12-03 (×2): 14 mL/h via EPIDURAL
  Filled 2012-12-02 (×3): qty 125

## 2012-12-02 MED ORDER — PENICILLIN G POTASSIUM 5000000 UNITS IJ SOLR
2.5000 10*6.[IU] | INTRAVENOUS | Status: DC
  Administered 2012-12-03 (×5): 2.5 10*6.[IU] via INTRAVENOUS
  Filled 2012-12-02 (×8): qty 2.5

## 2012-12-02 MED ORDER — TERBUTALINE SULFATE 1 MG/ML IJ SOLN: 0.2500 mg | Freq: Once | INTRAMUSCULAR | Status: AC | PRN

## 2012-12-02 MED ORDER — BUTORPHANOL TARTRATE 1 MG/ML IJ SOLN
1.0000 mg | INTRAMUSCULAR | Status: DC | PRN
  Administered 2012-12-02: 1 mg via INTRAVENOUS
  Filled 2012-12-02: qty 1

## 2012-12-02 NOTE — Progress Notes (Signed)
Subjective: Pt asleep and s.o. "Larry" asleep on couch.  RN placed cytotec at 2251, and pt received Ambien as well.  Fetal tachycardia noted on admission, w/ FHR in 170s, and given IVFB and position changes. Despite interventions, tachycardia noted until around 2200.  Questionable decels around 2130. Pt reported adequate hydration yesterday.    Objective: BP 132/70  Pulse 87  Temp(Src) 98.7 F (37.1 C) (Oral)  Resp 20  Ht 5\' 7"  (1.702 m)  Wt 197 lb (89.359 kg)  BMI 30.85 kg/m2  LMP 02/18/2012      FHT:  FHR: 145 bpm, variability: moderate,  accelerations:  Present,  decelerations:  Absent UC:   irregular, every 12-15 minutes w/ some UI SVE:   Dilation: Closed Effacement (%): Thick Station: -2 Exam by:: h Brittania Sudbeck cnm  Labs: Lab Results  Component Value Date   WBC 6.7 12/01/2012   HGB 11.2* 12/01/2012   HCT 33.4* 12/01/2012   MCV 84.8 12/01/2012   PLT 206 12/01/2012    Assessment / Plan: 1. [redacted]w[redacted]d 2. GBS pos 3. IOL for post term  Labor: s/p one dose of cytotec at 2251 (delayed insertion b/c of fetal tachycardia) Preeclampsia:  no signs or symptoms of toxicity Fetal Wellbeing:  Category I Pain Control:  Labor support without medications I/D:  n/a Anticipated MOD:  NSVD 1. Continue ripening tonight 2. Support as needed 3. C/w MD prn  Kanasia Gayman H 12/02/2012, 12:50 AM

## 2012-12-02 NOTE — Progress Notes (Signed)
  Subjective: Sleeping at intervals from Stadol--"felt drunk", but has helped with pain.  Objective: BP 120/75  Pulse 96  Temp(Src) 98.9 F (37.2 C) (Oral)  Resp 18  Ht 5\' 7"  (1.702 m)  Wt 197 lb (89.359 kg)  BMI 30.85 kg/m2  SpO2 100%  LMP 02/18/2012     FHT:  Category 1 UC:   irregular, every 2-5 minutes  Assessment / Plan: Induction of labor Unripe cervix S/P cytotech #3 at 8pm Re-evaluate cervix at 12.  Nigel Bridgeman 12/02/2012, 11:47 PM

## 2012-12-02 NOTE — Progress Notes (Signed)
Spoke with Annamary Rummage CNM. Will not start Pitocin at this time. Will continue to monitor.

## 2012-12-02 NOTE — Progress Notes (Signed)
  Subjective: Pt voicing concerns re recent decel.  Discussed with pt that the variability is good and gave reassurance.  Pt desires to get out of her room and walk.  We agreed to allow her to walk for 15 min intervals.  Pt also desires to get labor moving, because she is hungry.  We agreed to reassess labor between 1500 and 1600 and potentially begin pitocin.  Reports cramping is getting stronger.  Objective: BP 107/77  Pulse 87  Temp(Src) 98.1 F (36.7 C) (Oral)  Resp 20  Ht 5\' 7"  (1.702 m)  Wt 197 lb (89.359 kg)  BMI 30.85 kg/m2  SpO2 100%  LMP 02/18/2012      FHT:  Cat I UC:   regular, every 1-5 minutes  SVE:   Dilation: Closed Effacement (%): 40 Station: -2 Exam by:: Cheyeanne Roadcap, CNM  Assessment / Plan:  Labor: Early labor Preeclampsia: no s/s Fetal Wellbeing: Cat I Pain Control: UCs are mild; able to cope well through UCs I/D: GBS prophylaxis per protocol  Anticipated MOD: SVD   Kraven Calk 12/02/2012, 2:39 PM

## 2012-12-02 NOTE — Progress Notes (Signed)
Subjective: Pt has slept well since taking Ambien.  Currently feels off and on cramps, but have not woken her out of her sleep she reports.  Declines need for pain intervention.  Denies VB or LOF. FOB resting on couch at bs.  Objective: BP 137/85  Pulse 92  Temp(Src) 98.7 F (37.1 C) (Oral)  Resp 20  Ht 5\' 7"  (1.702 m)  Wt 197 lb (89.359 kg)  BMI 30.85 kg/m2  LMP 02/18/2012      FHT:  FHR: 140 bpm, variability: min-mod,  accelerations:  Present,  decelerations:  Absent UC:   Irregular SVE:   Dilation: Closed Effacement (%): 30 Station: -2 Exam by:: h Chayce Rullo cnm  Labs: Lab Results  Component Value Date   WBC 6.7 12/01/2012   HGB 11.2* 12/01/2012   HCT 33.4* 12/01/2012   MCV 84.8 12/01/2012   PLT 206 12/01/2012    Assessment / Plan: 1. [redacted]w[redacted]d 2. IOL for postterm 3. GBS pos   Labor: induction in progress Preeclampsia:  no signs or symptoms of toxicity Fetal Wellbeing:  Category I Pain Control:  Labor support without medications I/D:  n/a Anticipated MOD:  NSVD  1. Placed 2nd dose of cytotec 2. Recheck at 0800 and consider additional cytotec vs pitocin 3. C/w MD prn  Jerimah Witucki H 12/02/2012, 4:22 AM

## 2012-12-02 NOTE — Progress Notes (Signed)
  Subjective: Called to [facility] for decel at 0830.  Position change and LR bolus begun prior to entering room. Decel resolved.  Objective: BP 134/81  Pulse 68  Temp(Src) 98.1 F (36.7 C) (Oral)  Resp 20  Ht 5\' 7"  (1.702 m)  Wt 197 lb (89.359 kg)  BMI 30.85 kg/m2  SpO2 100%  LMP 02/18/2012      FHT:  FHR: 145 bpm, variability: moderate,  accelerations:  Present,  decelerations:  Present 1 decel lasting 2.5 min down to 45, resolves back up to baseline. UC:   regular, every 1 - 3.5 minutes  SVE:   Dilation: 1 Effacement (%): 50 Station: -2;-1 Exam by:: Dyshon Philbin, CNM  Assessment / Plan:  Labor: Early labor - IOL for PDs Preeclampsia: no s/s Fetal Wellbeing: Cat II - resolved with position change and bolus Pain Control: UCs are mild, pt would like to avoid epidural  I/D: GBS prophylaxis per protocol Anticipated MOD: SVD   Tiffany Li 12/02/2012, 4:10 PM

## 2012-12-02 NOTE — Progress Notes (Signed)
  Subjective: Comfortable--UCs are a 2/10, very mild, unaware of many of them.  Ate light meal at 6pm.  Family at bedside.  Objective: BP 120/75  Pulse 96  Temp(Src) 98.9 F (37.2 C) (Oral)  Resp 18  Ht 5\' 7"  (1.702 m)  Wt 197 lb (89.359 kg)  BMI 30.85 kg/m2  SpO2 100%  LMP 02/18/2012      FHT:  Category 1--negative spontaneous CST in segments, with segments of reactivity UC:   Irregular, mild SVE:   Dilation: 1 Effacement (%): 50 Station: -2 Exam by:: Nigel Bridgeman, CNM Cervix very posterior--difficult to reach, even with patient sitting on hands--could barely get to os. Vtx presentation verified by BS US  GBS prophylaxis not begun yet  Assessment / Plan: Induction of labor due to postdates GBS positive Unfavorable cervix  Plan: Reviewed options for induction, including repeating cytotech, foley bulb, pitocin for ripening overnight, and low-dose pitocin when cervix more ripe.  I recommend cytotech, then foley bulb as option if cervix comes more forward. Patient agreeable with plan. Will start GBS prophylaxis now. Ambien prn  Tiffany Li 12/02/2012, 8:09 PM

## 2012-12-02 NOTE — Progress Notes (Signed)
  Subjective: Feeling more pain, 7/10 now.  Feeling vaginal/rectal pressure.  Objective: BP 120/75  Pulse 96  Temp(Src) 98.9 F (37.2 C) (Oral)  Resp 18  Ht 5\' 7"  (1.702 m)  Wt 197 lb (89.359 kg)  BMI 30.85 kg/m2  SpO2 100%  LMP 02/18/2012      FHT:  Category 1, baseline 140-150. UC:   regular, every 2-3 minutes SVE:   Dilation: 1 Effacement (%): 50 Station: -2 Exam by:: Nigel Bridgeman, CNM Cervix still very posterior.  Assessment / Plan: Induction due to postdates Early labor Unfavorable cervix GBS positive--on prophylaxis Will CTO--pain medication prn.  Nigel Bridgeman 12/02/2012, 9:39 PM

## 2012-12-02 NOTE — Progress Notes (Signed)
  Subjective: Pt voiced that she is very hungry, and UCs are still not strong.  Objective: BP 134/81  Pulse 68  Temp(Src) 98.1 F (36.7 C) (Oral)  Resp 20  Ht 5\' 7"  (1.702 m)  Wt 197 lb (89.359 kg)  BMI 30.85 kg/m2  SpO2 100%  LMP 02/18/2012      FHT:  Cat I UC:   regular, every 1.5 - 4 minutes  SVE:   Dilation: 1 Effacement (%): 50 Station: -2;-1 Exam by:: Karinne Schmader, CNM  Assessment / Plan:  C/w Dr. Normand Sloop Pt to have a light meal and reassess labor progress after meal - may place foley bulb.  Pt agreeable.  Labor: Early labor  Preeclampsia: no s/s  Fetal Wellbeing: Cat I  Pain Control: UCs are mild; able to cope well through UCs  I/D: GBS prophylaxis per protocol  Anticipated MOD: SVD    Tiffany Li 12/02/2012, 5:33 PM

## 2012-12-03 ENCOUNTER — Encounter (HOSPITAL_COMMUNITY): Payer: Self-pay | Admitting: Anesthesiology

## 2012-12-03 ENCOUNTER — Telehealth (HOSPITAL_COMMUNITY): Payer: Self-pay | Admitting: *Deleted

## 2012-12-03 ENCOUNTER — Encounter (HOSPITAL_COMMUNITY): Payer: Self-pay

## 2012-12-03 ENCOUNTER — Inpatient Hospital Stay (HOSPITAL_COMMUNITY): Payer: 59 | Admitting: Anesthesiology

## 2012-12-03 MED ORDER — SODIUM BICARBONATE 8.4 % IV SOLN
INTRAVENOUS | Status: AC
  Filled 2012-12-03: qty 50

## 2012-12-03 MED ORDER — WITCH HAZEL-GLYCERIN EX PADS: 1.0000 "application " | MEDICATED_PAD | CUTANEOUS | Status: DC | PRN

## 2012-12-03 MED ORDER — SODIUM BICARBONATE 8.4 % IV SOLN
INTRAVENOUS | Status: DC | PRN
  Administered 2012-12-03: 5 mL via EPIDURAL

## 2012-12-03 MED ORDER — ONDANSETRON HCL 4 MG PO TABS: 4.0000 mg | ORAL_TABLET | ORAL | Status: DC | PRN

## 2012-12-03 MED ORDER — SENNOSIDES-DOCUSATE SODIUM 8.6-50 MG PO TABS
2.0000 | ORAL_TABLET | Freq: Every day | ORAL | Status: DC
  Administered 2012-12-04: 2 via ORAL

## 2012-12-03 MED ORDER — TETANUS-DIPHTH-ACELL PERTUSSIS 5-2.5-18.5 LF-MCG/0.5 IM SUSP: 0.5000 mL | Freq: Once | INTRAMUSCULAR | Status: DC

## 2012-12-03 MED ORDER — LANOLIN HYDROUS EX OINT: TOPICAL_OINTMENT | CUTANEOUS | Status: DC | PRN

## 2012-12-03 MED ORDER — OXYCODONE-ACETAMINOPHEN 5-325 MG PO TABS
1.0000 | ORAL_TABLET | ORAL | Status: DC | PRN
  Administered 2012-12-04 – 2012-12-05 (×2): 1 via ORAL
  Filled 2012-12-03 (×2): qty 1

## 2012-12-03 MED ORDER — SIMETHICONE 80 MG PO CHEW: 80.0000 mg | CHEWABLE_TABLET | ORAL | Status: DC | PRN

## 2012-12-03 MED ORDER — LIDOCAINE-EPINEPHRINE (PF) 2 %-1:200000 IJ SOLN
INTRAMUSCULAR | Status: AC
  Filled 2012-12-03: qty 20

## 2012-12-03 MED ORDER — DIBUCAINE 1 % RE OINT: 1.0000 "application " | TOPICAL_OINTMENT | RECTAL | Status: DC | PRN

## 2012-12-03 MED ORDER — DIPHENHYDRAMINE HCL 25 MG PO CAPS: 25.0000 mg | ORAL_CAPSULE | Freq: Four times a day (QID) | ORAL | Status: DC | PRN

## 2012-12-03 MED ORDER — ONDANSETRON HCL 4 MG/2ML IJ SOLN: 4.0000 mg | INTRAMUSCULAR | Status: DC | PRN

## 2012-12-03 MED ORDER — FENTANYL 2.5 MCG/ML BUPIVACAINE 1/10 % EPIDURAL INFUSION (WH - ANES)
INTRAMUSCULAR | Status: DC | PRN
  Administered 2012-12-03: 14 mL/h via EPIDURAL

## 2012-12-03 MED ORDER — LIDOCAINE HCL (PF) 1 % IJ SOLN
INTRAMUSCULAR | Status: DC | PRN
  Administered 2012-12-03 (×2): 4 mL

## 2012-12-03 MED ORDER — ZOLPIDEM TARTRATE 5 MG PO TABS: 5.0000 mg | ORAL_TABLET | Freq: Every evening | ORAL | Status: DC | PRN

## 2012-12-03 MED ORDER — BENZOCAINE-MENTHOL 20-0.5 % EX AERO
1.0000 "application " | INHALATION_SPRAY | CUTANEOUS | Status: DC | PRN
  Administered 2012-12-03: 1 via TOPICAL
  Filled 2012-12-03: qty 56

## 2012-12-03 MED ORDER — PRENATAL MULTIVITAMIN CH
1.0000 | ORAL_TABLET | Freq: Every day | ORAL | Status: DC
  Administered 2012-12-04 – 2012-12-05 (×2): 1 via ORAL
  Filled 2012-12-03 (×2): qty 1

## 2012-12-03 MED ORDER — IBUPROFEN 600 MG PO TABS
600.0000 mg | ORAL_TABLET | Freq: Four times a day (QID) | ORAL | Status: DC
  Administered 2012-12-03 – 2012-12-05 (×7): 600 mg via ORAL
  Filled 2012-12-03 (×7): qty 1

## 2012-12-03 NOTE — Anesthesia Procedure Notes (Signed)
Epidural Patient location during procedure: OB Start time: 12/03/2012 1:23 AM  Staffing Anesthesiologist: Malen Gauze, Kaidyn Hernandes A. Performed by: anesthesiologist   Preanesthetic Checklist Completed: patient identified, site marked, surgical consent, pre-op evaluation, timeout performed, IV checked, risks and benefits discussed and monitors and equipment checked  Epidural Patient position: sitting Prep: site prepped and draped and DuraPrep Patient monitoring: continuous pulse ox and blood pressure Approach: midline Injection technique: LOR air  Needle:  Needle type: Tuohy  Needle gauge: 17 G Needle length: 9 cm and 9 Needle insertion depth: 6 cm Catheter type: closed end flexible Catheter size: 19 Gauge Catheter at skin depth: 11 cm Test dose: negative and Other  Assessment Events: blood not aspirated, injection not painful, no injection resistance, negative IV test and no paresthesia  Additional Notes Patient identified. Risks and benefits discussed including failed block, incomplete  Pain control, post dural puncture headache, nerve damage, paralysis, blood pressure Changes, nausea, vomiting, reactions to medications-both toxic and allergic and post Partum back pain. All questions were answered. Patient expressed understanding and wished to proceed. Sterile technique was used throughout procedure. Epidural site was Dressed with sterile barrier dressing. No paresthesias, signs of intravascular injection Or signs of intrathecal spread were encountered.  Patient was more comfortable after the epidural was dosed. Please see RN's note for documentation of vital signs and FHR which are stable.

## 2012-12-03 NOTE — Progress Notes (Signed)
  Subjective: Comfortable with epidural--SROM at 5:35am.  Objective: BP 139/87  Pulse 82  Temp(Src) 98.2 F (36.8 C) (Oral)  Resp 18  Ht 5\' 7"  (1.702 m)  Wt 197 lb (89.359 kg)  BMI 30.85 kg/m2  SpO2 100%  LMP 02/18/2012   Total I/O In: -  Out: 300 [Urine:300]  FHT:  Category 1, occasional mild variable UC:   Irregular pattern, every 2-4 minutes SVE:   2 cm, 80%, vtx, -1--vtx well-applied Leaking clear fluid. IUPC placed Pitocin on 4 mu/min  Assessment / Plan: Early labor Will continue augmentation  Tiffany Li 12/03/2012, 6:12 AM

## 2012-12-03 NOTE — Progress Notes (Signed)
  Subjective: Comfortable with epidural.  Family back at bedside.  Objective: BP 124/91  Pulse 76  Temp(Src) 98.9 F (37.2 C) (Oral)  Resp 18  Ht 5\' 7"  (1.702 m)  Wt 197 lb (89.359 kg)  BMI 30.85 kg/m2  SpO2 100%  LMP 02/18/2012      FHT:  Category 1 UC:   irregular, every 2-4 minutes SVE:   Dilation: 2 Effacement (%): 80 Station: -2 Exam by:: Nigel Bridgeman, CNM Cervix still posterior, but exam accomplishable now. Membranes bulging slightly  Assessment / Plan: Induction due to postdates Progressive early labor Will start pitocin now--AROM when vtx descends lower.  Shimon Trowbridge 12/03/2012, 3:04 AM

## 2012-12-03 NOTE — Progress Notes (Signed)
  Subjective: Very uncomfortable, crying with contractions--"had enough".    Objective: BP 120/75  Pulse 96  Temp(Src) 98.9 F (37.2 C) (Oral)  Resp 18  Ht 5\' 7"  (1.702 m)  Wt 197 lb (89.359 kg)  BMI 30.85 kg/m2  SpO2 100%  LMP 02/18/2012      FHT: Category 1 UC:   regular, every 3 minutes SVE:   Dilation: 1 (no change) Effacement (%): 50 Station: -2 Exam by:: Nigel Bridgeman, CNM Cervix still very posterior. Well-developed lower uterine segment, patient very uncomfortable with exam, difficult to fully assess cervix.  Assessment / Plan: Early labor Very posterior cervix Plan epidural placement to facilitate better exams and patient comfort. Anticipate starting pitocin after epidural placed.  Nigel Bridgeman 12/03/2012, 12:54 AM

## 2012-12-03 NOTE — Progress Notes (Signed)
  Subjective: At [facility], pt is very emotional. Encouragement and reassurance given.  Objective: BP 115/62  Pulse 68  Temp(Src) 98.6 F (37 C) (Oral)  Resp 18  Ht 5\' 7"  (1.702 m)  Wt 197 lb (89.359 kg)  BMI 30.85 kg/m2  SpO2 100%  LMP 02/18/2012 I/O last 3 completed shifts: In: -  Out: 300 [Urine:300]    FHT:  Cat I UC:   regular, every 1.5 - 3 minutes  SVE:   Dilation: Lip/rim Effacement (%): 90 Station: +2 Exam by:: Haroldine Laws CNM   Assessment / Plan:  Labor: Active labor Preeclampsia: no s/s Fetal Wellbeing:  Pain Control: Epidural I/D: GBS prophylaxis Anticipated MOD: SVD   Tiffany Li 12/03/2012, 5:03 PM

## 2012-12-03 NOTE — Progress Notes (Signed)
  Subjective: Pt resting comfortably with epidural.  Pitocin begun through the night.     Objective: BP 101/57  Pulse 68  Temp(Src) 98.2 F (36.8 C) (Oral)  Resp 18  Ht 5\' 7"  (1.702 m)  Wt 197 lb (89.359 kg)  BMI 30.85 kg/m2  SpO2 100%  LMP 02/18/2012 I/O last 3 completed shifts: In: -  Out: 300 [Urine:300]    FHT:  Cat I UC:   regular, every 1.5 - 4.5 minutes  Pitocin: 4 miliU MVUs: 200  SVE@ 0604:   Dilation: 2 (no change) Effacement (%): 80 Station: -2 Exam by:: Nigel Bridgeman, CNM  Assessment / Plan:  Labor: Early labor - IOL for PD Preeclampsia: no s/s Fetal Wellbeing: Cat I Pain Control: Epidural I/D: GBS prophylaxis per protocol Anticipated MOD: SVD   Chasitty Hehl 12/03/2012, 7:28 AM

## 2012-12-03 NOTE — Anesthesia Preprocedure Evaluation (Signed)
Anesthesia Evaluation  Patient identified by MRN, date of birth, ID band Patient awake    Reviewed: Allergy & Precautions, H&P , Patient's Chart, lab work & pertinent test results  Airway Mallampati: III TM Distance: >3 FB Neck ROM: full    Dental no notable dental hx. (+) Teeth Intact   Pulmonary neg pulmonary ROS,  breath sounds clear to auscultation  Pulmonary exam normal       Cardiovascular negative cardio ROS  Rhythm:regular Rate:Normal     Neuro/Psych  Headaches, negative neurological ROS  negative psych ROS   GI/Hepatic negative GI ROS, Neg liver ROS,   Endo/Other  negative endocrine ROS  Renal/GU negative Renal ROS  negative genitourinary   Musculoskeletal   Abdominal Normal abdominal exam  (+)   Peds  Hematology negative hematology ROS (+) anemia ,   Anesthesia Other Findings   Reproductive/Obstetrics (+) Pregnancy                           Anesthesia Physical Anesthesia Plan  ASA: II  Anesthesia Plan: Epidural   Post-op Pain Management:    Induction:   Airway Management Planned:   Additional Equipment:   Intra-op Plan:   Post-operative Plan:   Informed Consent: I have reviewed the patients History and Physical, chart, labs and discussed the procedure including the risks, benefits and alternatives for the proposed anesthesia with the patient or authorized representative who has indicated his/her understanding and acceptance.     Plan Discussed with: Anesthesiologist  Anesthesia Plan Comments:         Anesthesia Quick Evaluation

## 2012-12-04 LAB — CBC
MCHC: 33.6 g/dL (ref 30.0–36.0)
Platelets: 184 10*3/uL (ref 150–400)
RDW: 14.2 % (ref 11.5–15.5)
WBC: 13.2 10*3/uL — ABNORMAL HIGH (ref 4.0–10.5)

## 2012-12-04 NOTE — Progress Notes (Signed)
Post Partum Day 1: S/P SVD with midline episiotomy  Subjective: Patient up ad lib, denies syncope or dizziness. Feeding:  Breastfeeding Contraceptive plan:   Nexplanon  Objective: Blood pressure 137/74, pulse 84, temperature 98.6 F (37 C), temperature source Oral, resp. rate 18, height 5\' 7"  (1.702 m), weight 197 lb (89.359 kg), last menstrual period 02/18/2012, SpO2 100.00%, unknown if currently breastfeeding.  Physical Exam:  General: alert, cooperative and no distress Lochia: appropriate Uterine Fundus: firm Incision: healing well DVT Evaluation: No evidence of DVT seen on physical exam. Negative Homan's sign.   Recent Labs  12/01/12 2025 12/04/12 0600  HGB 11.2* 11.0*  HCT 33.4* 32.7*    Assessment/Plan: S/P Vaginal delivery day 1 Continue current care Plan for discharge tomorrow   LOS: 3 days   Terry Abila 12/04/2012, 10:14 AM

## 2012-12-04 NOTE — Anesthesia Postprocedure Evaluation (Signed)
  Anesthesia Post-op Note  Patient: Producer, television/film/video  Procedure(s) Performed: * No procedures listed *  Patient Location: Mother/Baby  Anesthesia Type:Epidural  Level of Consciousness: awake and alert   Airway and Oxygen Therapy: Patient Spontanous Breathing  Post-op Pain: mild  Post-op Assessment: Patient's Cardiovascular Status Stable, Respiratory Function Stable, No signs of Nausea or vomiting, Pain level controlled and No headache  Post-op Vital Signs: Reviewed and stable  Complications: No apparent anesthesia complications

## 2012-12-04 NOTE — Progress Notes (Signed)
Patient ID: Tiffany Li, female   DOB: 1990/10/21, 22 y.o.   MRN: [id number] Circumcision Note Consent obtained from parent. Time out done Penis cleaned with Betadine 1cc 1% lidocaine used for dorsal block Mogen used to do circumcision Hemostasis noted.   No complications.Circumcision Note Consent obtained from parent.

## 2012-12-05 MED ORDER — IBUPROFEN 600 MG PO TABS: 600.0000 mg | ORAL_TABLET | Freq: Four times a day (QID) | ORAL | Status: AC

## 2012-12-05 NOTE — Discharge Summary (Signed)
  Vaginal Delivery Discharge Summary  Tiffany Li  DOB:    28-Dec-1990 MRN:    [id number] CSN:    [id number]  Date of admission:                  12/01/12  Date of discharge:                   12/05/12  Procedures this admission: SVD with a midline episiotomy 2nd degree  Date of Delivery: 12/03/12 by Nigel Bridgeman  Newborn Data:  Live born female  Birth Weight: 9 lb 0.8 oz (4105 g) APGAR: 6, 9  Home with mother. Name: "Tiffany Li" Circumcision Plan: Inpatient  History of Present Illness:  Ms. Tiffany Li is a 22 y.o. female, G1P1001, who presents at [redacted]w[redacted]d weeks gestation. The patient has been followed at the [facility] and Gynecology division of [facility]. She was admitted induction of labor. Her pregnancy has been complicated by:   Patient Active Problem List   Diagnosis Date Noted  . Vaginal delivery with [facility] 12/03/2012  . Post term pregnancy, antepartum condition or complication 12/02/2012  . Anemia 09/03/2012  . 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 Pregnant state, incidental 04/23/2012    Hospital course:  The patient was admitted for IOL for PDs.   Her labor was not complicated. She proceeded to have a vaginal delivery of a healthy infant. Her delivery was not complicated. Her postpartum course was not complicated.  She was discharged to home on postpartum day 2 doing well.  Feeding:  breast  Contraception:  Nexplanon  Discharge hemoglobin:  Hemoglobin  Date Value Range Status  12/04/2012 11.0* 12.0 - 15.0 g/dL Final     HCT  Date Value Range Status  12/04/2012 32.7* 36.0 - 46.0 % Final    Discharge Physical Exam:   General: alert, cooperative and no distress Lochia: appropriate Uterine Fundus: firm Incision: healing well DVT Evaluation: No evidence of DVT seen on physical exam. Negative Homan's sign.  Intrapartum Procedures: spontaneous vaginal delivery and GBS prophylaxis Postpartum Procedures:  none Complications-Operative and Postpartum: 2nd degree perineal laceration Episiotomy  Discharge Diagnoses: Term Pregnancy-delivered  Discharge Information:  Activity:           pelvic rest Diet:                routine Medications: PNV and Ibuprofen Condition:      stable Instructions:  refer to practice specific booklet Discharge to: home  Follow-up Information   Follow up with [facility]. Schedule an appointment as soon as possible for a visit in 5 weeks. (Call with any questions or concerns.)    Contact information:   [address] [place] [address] [phone]       Haroldine Laws 12/05/2012

## 2012-12-13 ENCOUNTER — Ambulatory Visit (HOSPITAL_COMMUNITY): Admit: 2012-12-13 | Payer: 59

## 2012-12-15 NOTE — Progress Notes (Signed)
Post discharge chart review completed.  

## 2013-05-05 ENCOUNTER — Other Ambulatory Visit: Payer: Self-pay

## 2014-05-01 ENCOUNTER — Encounter (HOSPITAL_COMMUNITY): Payer: Self-pay

## 2014-06-10 IMAGING — US US FETAL BPP W/O NONSTRESS
1 series · 9 of 9 positions shown · non-contrast
Comparison: none

CLINICAL DATA: Decreased fetal movement

[Series 1: us fetal bpp w/o nonstress · non-contrast · 9 acquisitions, 9 frames shown]
[im 1/9]
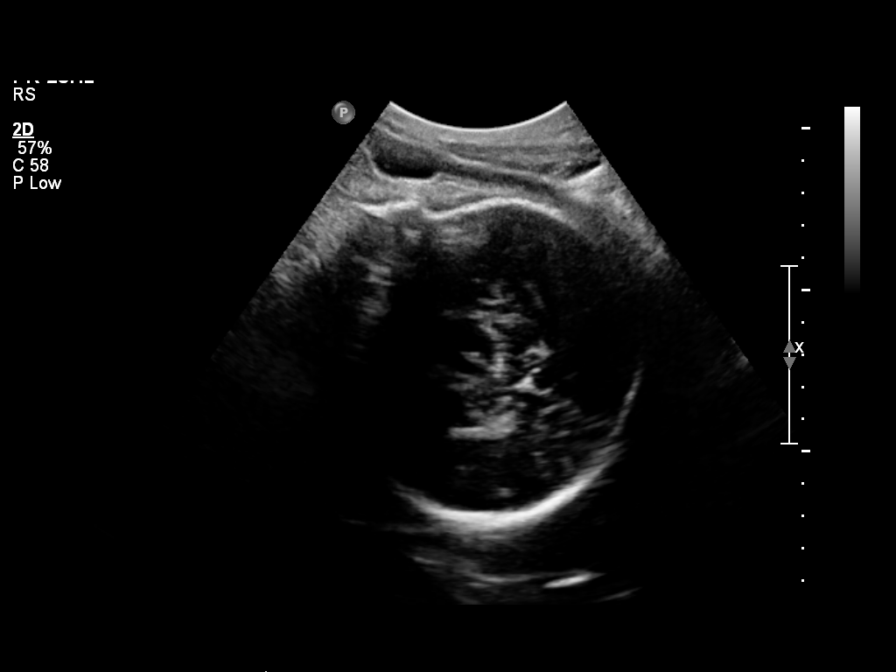
[im 2/9]
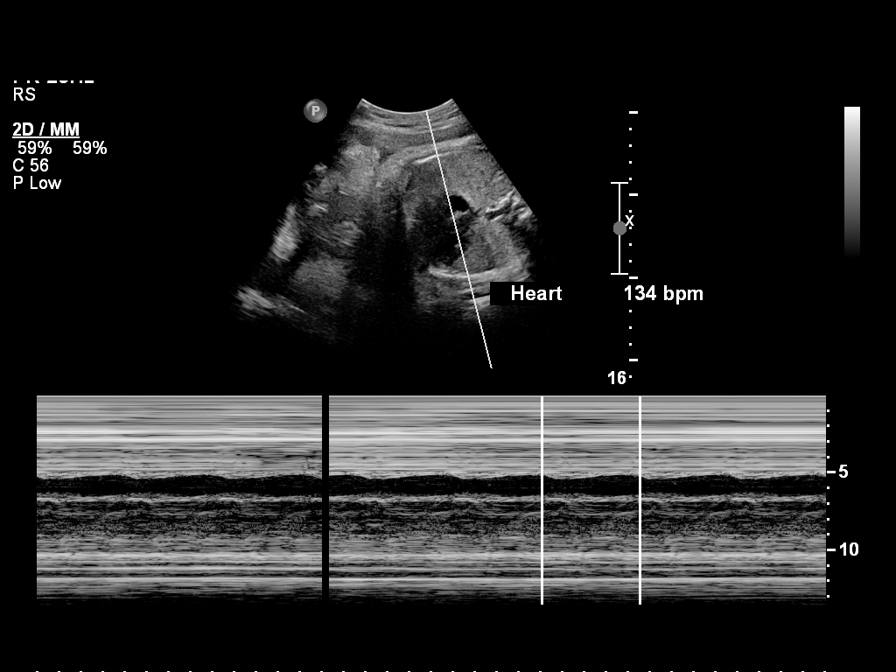
[im 3/9]
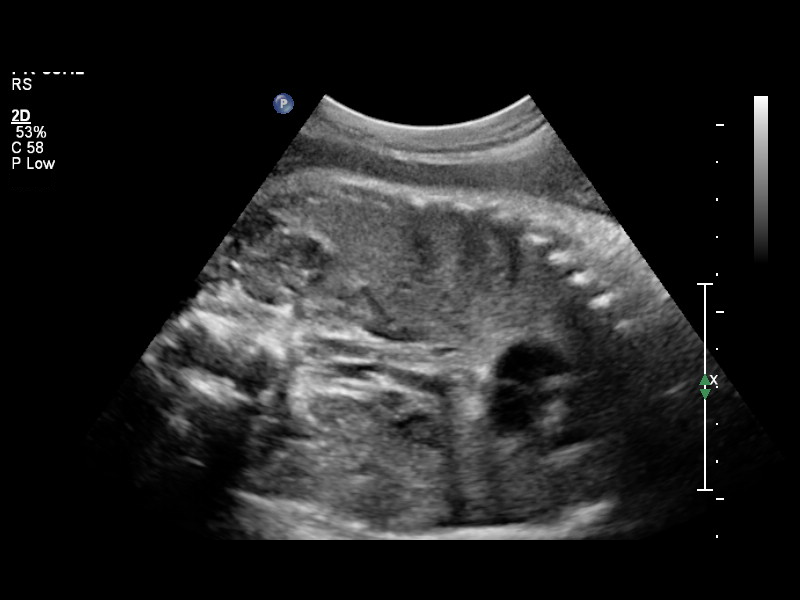
[im 4/9]
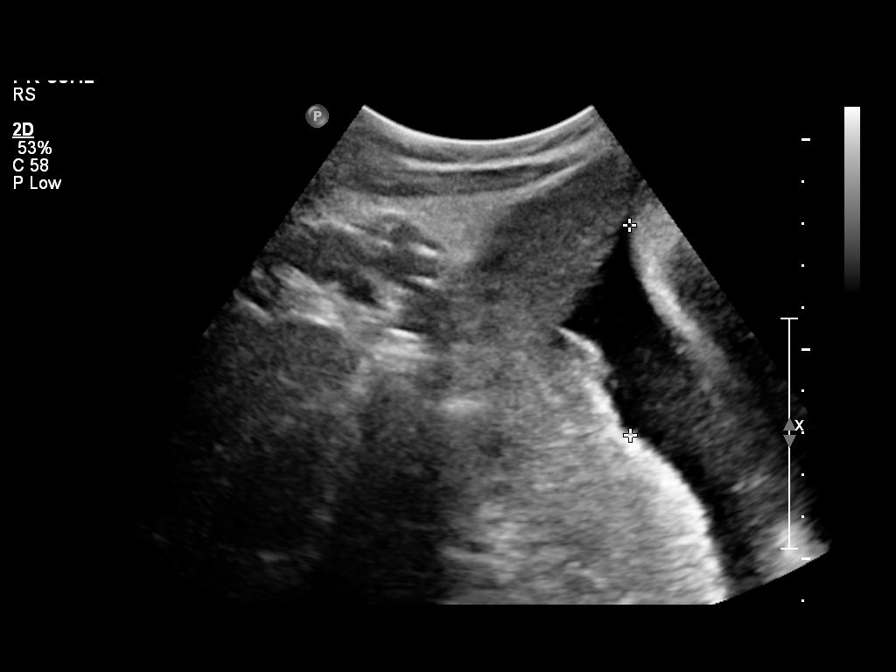
[im 5/9]
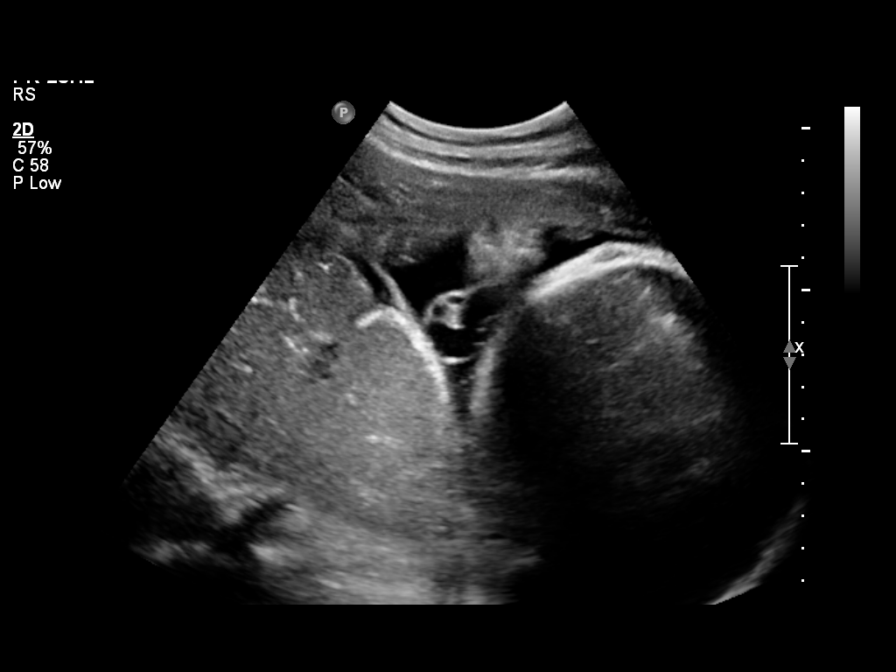
[im 6/9]
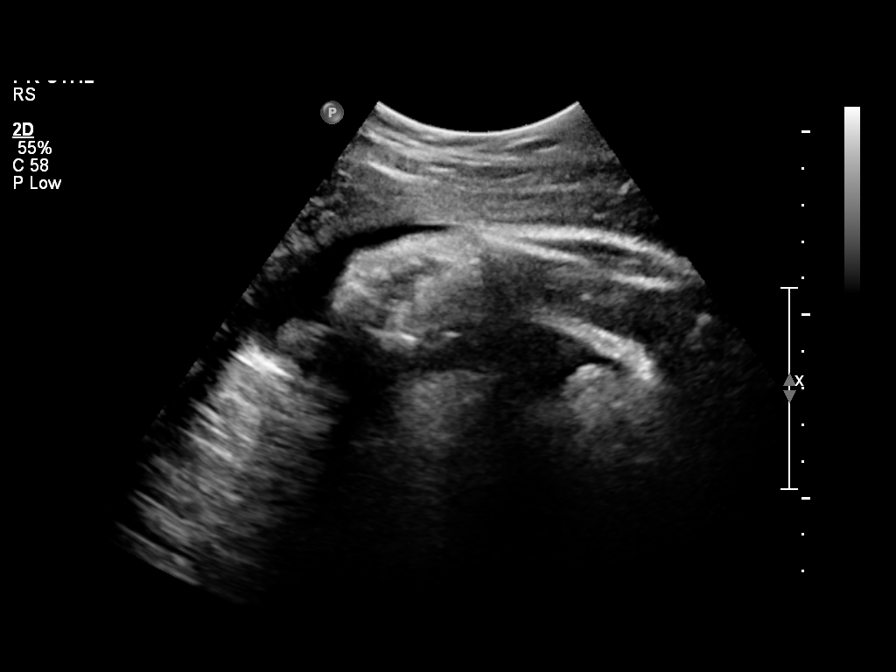
[im 7/9]
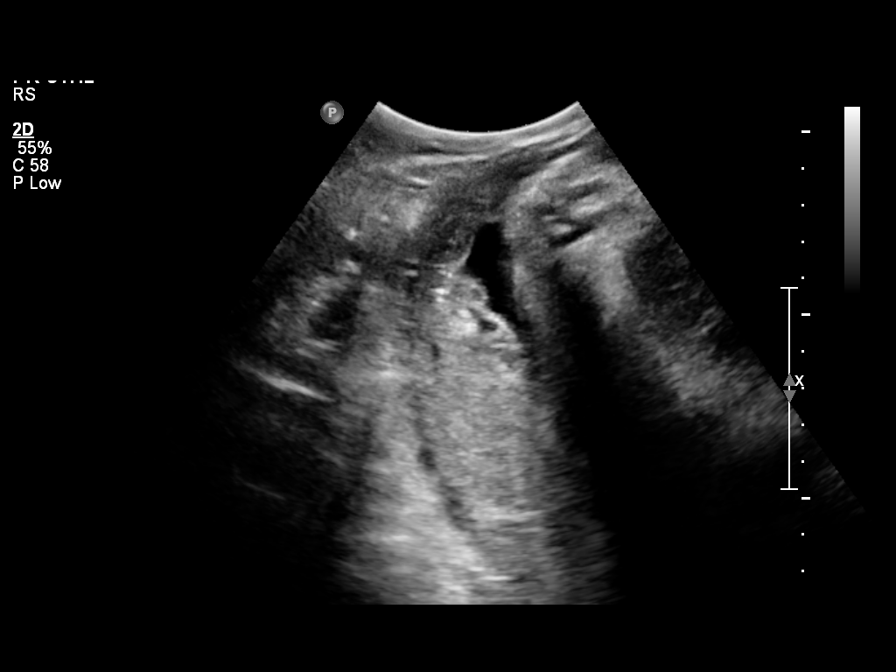
[im 8/9]
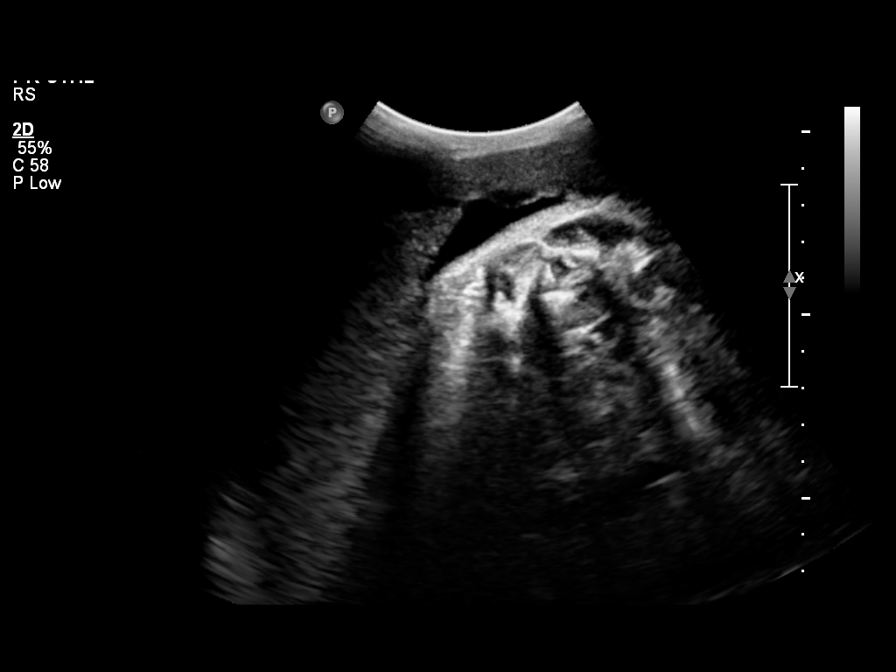
[im 9/9]
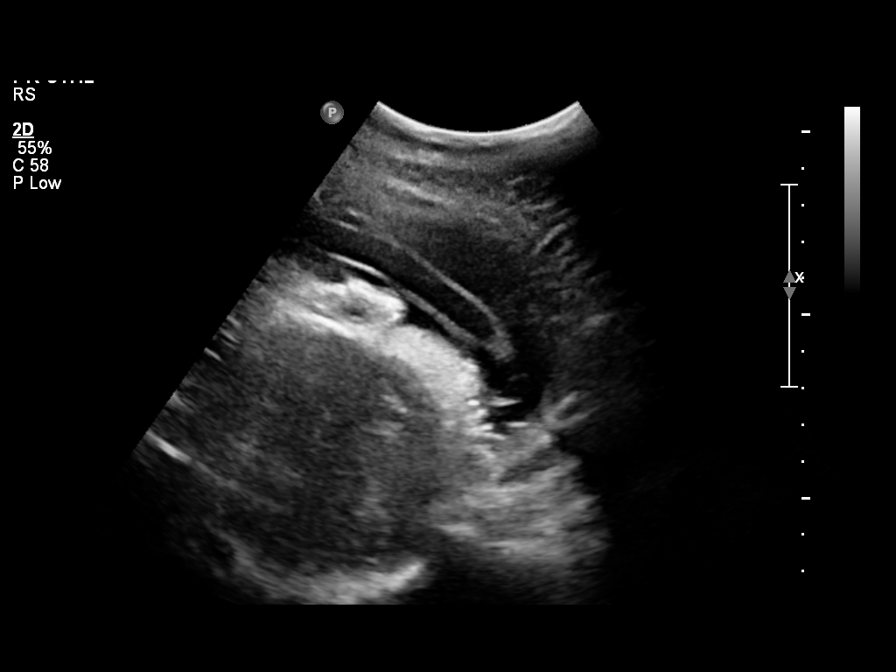

[9 of 9 positions shown; findings below may reference images not displayed]

ULTRAOUND FETAL BPP W/O NONSTRESS

BIOPHYSICAL PROFILE

Number of Fetuses: 1
Heart Rate:  801bpm
Movement:  Identified
Presentation:  Cephalic
Previa:  Not identified
Amniotic Fluid (subjective):  Within normal limits

Vertical pocket:  5.0cm

MATERNAL FINDINGS:
BPP:
Movement:  2      Time:  3 minutes
Breathing:  2
Tone:  2
Amniotic Fluid:  2

Total Score:  8
IMPRESSION: Biophysical profile [DATE].

This exam is performed on an emergent basis and does not
comprehensively evaluate fetal size, dating, or anatomy, and a
follow-up complete OB US should be considered if further fetal
assessment is warranted.

## 2017-12-11 NOTE — Telephone Encounter (Signed)
Preadmission screen
